# Patient Record
Sex: Female | Born: 1980 | Race: White | Hispanic: No | Marital: Married | Smoking: Former smoker
Health system: Southern US, Community
[De-identification: ages and names within clinical notes are randomized; demographics above are authoritative.]

## PROBLEM LIST (undated history)

## (undated) ENCOUNTER — Inpatient Hospital Stay (HOSPITAL_COMMUNITY): Payer: Self-pay

## (undated) DIAGNOSIS — J302 Other seasonal allergic rhinitis: Secondary | ICD-10-CM

## (undated) DIAGNOSIS — R000 Tachycardia, unspecified: Secondary | ICD-10-CM

## (undated) DIAGNOSIS — I498 Other specified cardiac arrhythmias: Secondary | ICD-10-CM

## (undated) DIAGNOSIS — I951 Orthostatic hypotension: Secondary | ICD-10-CM

## (undated) DIAGNOSIS — G90A Postural orthostatic tachycardia syndrome (POTS): Secondary | ICD-10-CM

## (undated) DIAGNOSIS — R12 Heartburn: Secondary | ICD-10-CM

## (undated) DIAGNOSIS — F419 Anxiety disorder, unspecified: Secondary | ICD-10-CM

## (undated) DIAGNOSIS — O26899 Other specified pregnancy related conditions, unspecified trimester: Secondary | ICD-10-CM

## (undated) HISTORY — DX: Other seasonal allergic rhinitis: J30.2

## (undated) HISTORY — DX: Tachycardia, unspecified: R00.0

## (undated) HISTORY — PX: OTHER SURGICAL HISTORY: SHX169

## (undated) HISTORY — PX: NO PAST SURGERIES: SHX2092

## (undated) HISTORY — PX: WISDOM TOOTH EXTRACTION: SHX21

---

## 2004-01-25 ENCOUNTER — Ambulatory Visit: Payer: Self-pay | Admitting: Family Medicine

## 2004-02-05 ENCOUNTER — Ambulatory Visit: Payer: Self-pay | Admitting: Family Medicine

## 2010-01-21 ENCOUNTER — Ambulatory Visit: Payer: Self-pay | Admitting: Diagnostic Radiology

## 2010-01-21 ENCOUNTER — Emergency Department (HOSPITAL_BASED_OUTPATIENT_CLINIC_OR_DEPARTMENT_OTHER): Admission: EM | Admit: 2010-01-21 | Discharge: 2010-01-21 | Payer: Self-pay | Admitting: Emergency Medicine

## 2011-01-07 LAB — OB RESULTS CONSOLE ABO/RH: RH Type: POSITIVE

## 2011-01-07 LAB — OB RESULTS CONSOLE RPR: RPR: NONREACTIVE

## 2011-01-29 ENCOUNTER — Inpatient Hospital Stay (HOSPITAL_COMMUNITY)
Admission: AD | Admit: 2011-01-29 | Discharge: 2011-01-29 | Disposition: A | Discharge status: Home / Self Care | Payer: Medicaid Other | Source: Ambulatory Visit | Attending: Obstetrics and Gynecology | Admitting: Obstetrics and Gynecology

## 2011-01-29 ENCOUNTER — Encounter (HOSPITAL_COMMUNITY): Payer: Self-pay | Admitting: *Deleted

## 2011-01-29 DIAGNOSIS — O219 Vomiting of pregnancy, unspecified: Secondary | ICD-10-CM | POA: Diagnosis present

## 2011-01-29 DIAGNOSIS — O210 Mild hyperemesis gravidarum: Secondary | ICD-10-CM | POA: Insufficient documentation

## 2011-01-29 LAB — URINALYSIS, ROUTINE W REFLEX MICROSCOPIC
Ketones, ur: >80 mg/dL — AB
Nitrite: NEGATIVE
Protein, ur: NEGATIVE mg/dL
Urobilinogen, UA: 1 mg/dL (ref 0.0–1.0)

## 2011-01-29 LAB — URINE MICROSCOPIC-ADD ON

## 2011-01-29 MED ORDER — PROMETHAZINE HCL 25 MG RE SUPP: 25.0000 mg | Freq: Four times a day (QID) | RECTAL | Status: AC | PRN

## 2011-01-29 MED ORDER — ONDANSETRON 8 MG/NS 50 ML IVPB
8.0000 mg | Freq: Once | INTRAVENOUS | Status: AC
Start: 2011-01-29 — End: 2011-01-29
  Administered 2011-01-29: 8 mg via INTRAVENOUS
  Filled 2011-01-29: qty 8

## 2011-01-29 MED ORDER — ONDANSETRON 4 MG PO TBDP: 4.0000 mg | ORAL_TABLET | Freq: Three times a day (TID) | ORAL | Status: DC | PRN

## 2011-01-29 MED ORDER — PROMETHAZINE HCL 25 MG/ML IJ SOLN
25.0000 mg | Freq: Once | INTRAMUSCULAR | Status: AC
  Administered 2011-01-29: 25 mg via INTRAVENOUS
  Filled 2011-01-29: qty 1

## 2011-01-29 MED ORDER — LACTATED RINGERS IV BOLUS (SEPSIS)
1000.0000 mL | Freq: Once | INTRAVENOUS | Status: AC
  Administered 2011-01-29: 1000 mL via INTRAVENOUS

## 2011-01-29 MED ORDER — DEXTROSE 5 % IN LACTATED RINGERS IV BOLUS
1000.0000 mL | Freq: Once | INTRAVENOUS | Status: AC
  Administered 2011-01-29: 1000 mL via INTRAVENOUS

## 2011-01-29 NOTE — Progress Notes (Signed)
Can't keep anything down, off and on. Had hyperemesis with first preg.   Symptoms are getting worse, zofran and phenergan not working.

## 2011-01-29 NOTE — ED Provider Notes (Signed)
History   Pt presents today c/o worsening N&V and dehydration. She states she has hyperemesis with her last pregnancy and she continues to have worsening sx with this preg. She denies vag dc, bleeding, fever, or any other sx at this time.  Chief Complaint  Patient presents with  . Morning Sickness   HPI  OB History    Grav Para Term Preterm Abortions TAB SAB Ect Mult Living   3 1 1  0 1 0 1 0 0 1      Past Medical History  Diagnosis Date  . Asthma     Past Surgical History  Procedure Date  . No past surgeries     History reviewed. No pertinent family history.  History  Substance Use Topics  . Smoking status: Never Smoker   . Smokeless tobacco: Not on file  . Alcohol Use: No    Allergies:  Allergies  Allergen Reactions  . Multivitamin Swelling    Prescriptions prior to admission  Medication Sig Dispense Refill  . albuterol (PROVENTIL HFA;VENTOLIN HFA) 108 (90 BASE) MCG/ACT inhaler Inhale 2 puffs into the lungs every 6 (six) hours as needed. Shortness of breathe       . budesonide (PULMICORT) 0.25 MG/2ML nebulizer solution Take 0.25 mg by nebulization daily. Shortness of breathe       . ondansetron (ZOFRAN-ODT) 4 MG disintegrating tablet Take 4 mg by mouth every 8 (eight) hours as needed. nausea       . promethazine (PHENERGAN) 25 MG tablet Take 25 mg by mouth every 6 (six) hours as needed. nausea         Review of Systems  Constitutional: Negative for fever.  Cardiovascular: Negative for chest pain.  Gastrointestinal: Positive for nausea and vomiting. Negative for abdominal pain, diarrhea and constipation.  Genitourinary: Negative for dysuria, urgency, frequency and hematuria.  Neurological: Negative for dizziness and headaches.  Psychiatric/Behavioral: Negative for depression and suicidal ideas.   Physical Exam   Blood pressure 117/87, pulse 91, temperature 98.5 F (36.9 C), resp. rate 20, height 5\' 6"  (1.676 m), weight 199 lb (90.266 kg).  Physical Exam    Nursing note and vitals reviewed. Constitutional: She is oriented to person, place, and time. She appears well-developed and well-nourished. No distress.  HENT:  Head: Normocephalic and atraumatic.  Eyes: EOM are normal. Pupils are equal, round, and reactive to light.  GI: Soft. She exhibits no distension. There is no tenderness. There is no rebound and no guarding.  Neurological: She is alert and oriented to person, place, and time.  Skin: Skin is warm and dry. She is not diaphoretic.  Psychiatric: She has a normal mood and affect. Her behavior is normal. Judgment and thought content normal.    MAU Course  Procedures  Results for orders placed during the hospital encounter of 01/29/11 (from the past 24 hour(s))  URINALYSIS, ROUTINE W REFLEX MICROSCOPIC     Status: Abnormal   Collection Time   01/29/11  7:20 PM      Component Value Range   Color, Urine YELLOW  YELLOW    Appearance CLEAR  CLEAR    Specific Gravity, Urine >1.030 (*) 1.005 - 1.030    pH 5.5  5.0 - 8.0    Glucose, UA NEGATIVE  NEGATIVE (mg/dL)   Hgb urine dipstick NEGATIVE  NEGATIVE    Bilirubin Urine SMALL (*) NEGATIVE    Ketones, ur >80 (*) NEGATIVE (mg/dL)   Protein, ur NEGATIVE  NEGATIVE (mg/dL)   Urobilinogen, UA 1.0  0.0 - 1.0 (mg/dL)   Nitrite NEGATIVE  NEGATIVE    Leukocytes, UA TRACE (*) NEGATIVE   URINE MICROSCOPIC-ADD ON     Status: Abnormal   Collection Time   01/29/11  7:20 PM      Component Value Range   Squamous Epithelial / LPF MANY (*) RARE    WBC, UA 3-6  <3 (WBC/hpf)   Bacteria, UA FEW (*) RARE    Urine-Other MUCOUS PRESENT        Assessment and Plan  Care of this pt was transferred to Georges Mouse, CNM at 8:08pm.  Clinton Gallant. Rice III, DrHSc, MPAS, PA-C  01/29/2011, 6:25 PM   Henrietta Hoover, PA 01/29/11 2009  Feeling better with IV hydration, Phenergan and Zofran  A/P: 30 y.o. G3P1011 at [redacted]w[redacted]d N/V in early pregnancy  Medication List  As of 01/30/2011  2:59 AM   START taking  these medications         promethazine 25 MG suppository   Commonly known as: PHENERGAN   Place 1 suppository (25 mg total) rectally every 6 (six) hours as needed for nausea. May also use suppositories vaginally         CONTINUE taking these medications         albuterol 108 (90 BASE) MCG/ACT inhaler   Commonly known as: PROVENTIL HFA;VENTOLIN HFA      budesonide 0.25 MG/2ML nebulizer solution   Commonly known as: PULMICORT      ondansetron 4 MG disintegrating tablet   Commonly known as: ZOFRAN-ODT   Take 1 tablet (4 mg total) by mouth every 8 (eight) hours as needed. nausea          Where to get your medications    These are the prescriptions that you need to pick up. We sent them to a specific pharmacy, so you will need to go there to get them.   [facility] - [place], [place] - [address]    [address] [place] [address]    Phone: [phone]        ondansetron 4 MG disintegrating tablet   promethazine 25 MG suppository            Follow up as scheduled

## 2011-01-30 ENCOUNTER — Encounter (HOSPITAL_COMMUNITY): Payer: Self-pay | Admitting: Advanced Practice Midwife

## 2011-02-04 ENCOUNTER — Encounter: Payer: Self-pay | Admitting: Internal Medicine

## 2011-02-05 ENCOUNTER — Institutional Professional Consult (permissible substitution): Payer: Self-pay | Admitting: Internal Medicine

## 2011-02-24 ENCOUNTER — Encounter (HOSPITAL_COMMUNITY): Payer: Self-pay | Admitting: Advanced Practice Midwife

## 2011-02-25 ENCOUNTER — Encounter: Payer: Self-pay | Admitting: Pulmonary Disease

## 2011-02-26 ENCOUNTER — Telehealth: Payer: Self-pay | Admitting: Internal Medicine

## 2011-02-26 ENCOUNTER — Encounter: Payer: Self-pay | Admitting: Internal Medicine

## 2011-02-26 ENCOUNTER — Ambulatory Visit (INDEPENDENT_AMBULATORY_CARE_PROVIDER_SITE_OTHER): Payer: Medicaid Other | Admitting: Internal Medicine

## 2011-02-26 DIAGNOSIS — J45909 Unspecified asthma, uncomplicated: Secondary | ICD-10-CM | POA: Insufficient documentation

## 2011-02-26 MED ORDER — BUDESONIDE-FORMOTEROL FUMARATE 160-4.5 MCG/ACT IN AERO: INHALATION_SPRAY | RESPIRATORY_TRACT | Status: DC

## 2011-02-26 NOTE — Assessment & Plan Note (Signed)
DDX of  difficult airways managment all start with A and  include Adherence, Ace Inhibitors, Acid Reflux, Active Sinus Disease, Alpha 1 Antitripsin deficiency, Anxiety masquerading as Airways dz,  ABPA,  allergy(esp in young), Aspiration (esp in elderly), Adverse effects of DPI,  Active smokers, plus two Bs  = Bronchiectasis and Beta blocker use..and one C= CHF  In this case Adherence is the biggest issue and starts with  inability to use HFA effectively and also  understand that SABA treats the symptoms but doesn't get to the underlying problem (inflammation).  I used  the analogy of putting steroid cream on a rash to help explain the meaning of topical therapy and the need to get the drug to the target tissue.  The proper method of use, as well as anticipated side effects, of this metered-dose inhaler are discussed and demonstrated to the patient. Improved to 75% so need to try combination therapy to see if symptoms can be controlled more effectively  Discussed in detail all the  indications, usual  risks and alternatives  relative to the benefits with patient who agrees to proceed with symbicort trial given risks of poorly controlled asthma during progressive IUP  See instructions for specific recommendations which were reviewed directly with the patient who was given a copy with highlighter outlining the key components.

## 2011-02-26 NOTE — Telephone Encounter (Signed)
ATC pt x1.  Was told that mail box is full and was unable to leave a message.

## 2011-02-26 NOTE — Patient Instructions (Signed)
symbicort 160 Take 2 puffs first thing in am and then another 2 puffs about 12 hours later.   Work on inhaler technique:  relax and gently blow all the way out then take a nice smooth deep breath back in, triggering the inhaler at same time you start breathing in.  Hold for up to 5 seconds if you can.  Rinse and gargle with water when done   If your mouth or throat starts to bother you,   I suggest you time the inhaler to your dental care and after using the inhaler(s) brush teeth and tongue with a baking soda containing toothpaste and when you rinse this out, gargle with it first to see if this helps your mouth and throat.      Please schedule a follow up office visit in 2 weeks, sooner if needed

## 2011-02-26 NOTE — Progress Notes (Signed)
  Subjective:    Patient ID: Jessica Hubbard, female    DOB: January 10, 1981, 30 y.o.   MRN: [id number]  HPI  60 yowf with " lifelong " allergies year round controlled with prn zyrtec and occ inhalers  increased  for 1st pregnancy around 2005 then back to baseline  and referred to pulmonary clinic 02/26/2011 by Dr Renaldo Fiddler for sob during her 2nd pregnancy.   02/26/2011 1st pulmonary eval @ [redacted] weeks gestation h/o intermittent asthma with less than 2 inhalers per year (increased during smoking which stopped 4 months before this pregancy) now  with increased need for saba x 2 months to point where uses up 5 x times daily so started on pulmicort x one month initially 2 bid and some better and didn't use quite so much ventolin but then gradually noted increased need for day and night ventolin and less consistent use of pulmicort which she isn't convinced is really working.  No purulent sputum or overt HB, some sinus congestion, no pain.  No unusual exposures/ triggers identified.      Review of Systems  Constitutional: Negative for fever, chills and unexpected weight change.  HENT: Positive for congestion and sneezing. Negative for ear pain, nosebleeds, sore throat, rhinorrhea, trouble swallowing, dental problem, voice change, postnasal drip and sinus pressure.   Eyes: Negative for visual disturbance.  Respiratory: Positive for shortness of breath. Negative for cough and choking.   Cardiovascular: Negative for chest pain and leg swelling.  Gastrointestinal: Negative for vomiting, abdominal pain and diarrhea.  Genitourinary: Negative for difficulty urinating.  Musculoskeletal: Negative for arthralgias.  Skin: Negative for rash.  Neurological: Negative for tremors, syncope and headaches.  Hematological: Does not bruise/bleed easily.       Objective:   Physical Exam  amb wf nad Wt 198 02/26/2011 HEENT: nl dentition, and orophanx. Nl external ear canals without cough reflex Moderate non-specific  turbinate edema with watery discharge   NECK :  without JVD/Nodes/TM/ nl carotid upstrokes bilaterally   LUNGS: no acc muscle use, clear to A and P bilaterally without cough on insp or exp maneuvers   CV:  RRR  no s3 or murmur or increase in P2, no edema   ABD:  soft and nontender with nl excursion in the supine position. No bruits or organomegaly, bowel sounds nl  MS:  warm without deformities, calf tenderness, cyanosis or clubbing  SKIN: warm and dry without lesions    NEURO:  alert, approp, no deficits        Assessment & Plan:

## 2011-02-27 NOTE — Telephone Encounter (Signed)
ATC NA and no option to leave a msg 

## 2011-02-28 NOTE — Telephone Encounter (Signed)
ATC x 1. Mailbox full.

## 2011-03-03 NOTE — Telephone Encounter (Signed)
ATC, mailbox still full, will sign encounter per protocol

## 2011-03-10 ENCOUNTER — Inpatient Hospital Stay (HOSPITAL_COMMUNITY): Admission: AD | Admit: 2011-03-10 | Payer: Self-pay | Source: Ambulatory Visit | Admitting: Obstetrics and Gynecology

## 2011-03-13 ENCOUNTER — Ambulatory Visit: Payer: Self-pay | Admitting: Internal Medicine

## 2011-05-23 ENCOUNTER — Encounter (HOSPITAL_COMMUNITY): Payer: Self-pay | Admitting: *Deleted

## 2011-05-23 ENCOUNTER — Inpatient Hospital Stay (HOSPITAL_COMMUNITY)
Admission: AD | Admit: 2011-05-23 | Discharge: 2011-05-23 | Disposition: A | Discharge status: Home / Self Care | Payer: Medicaid Other | Source: Ambulatory Visit | Attending: Obstetrics and Gynecology | Admitting: Obstetrics and Gynecology

## 2011-05-23 ENCOUNTER — Other Ambulatory Visit: Payer: Self-pay

## 2011-05-23 DIAGNOSIS — O368390 Maternal care for abnormalities of the fetal heart rate or rhythm, unspecified trimester, not applicable or unspecified: Secondary | ICD-10-CM

## 2011-05-23 DIAGNOSIS — O368990 Maternal care for other specified fetal problems, unspecified trimester, not applicable or unspecified: Secondary | ICD-10-CM

## 2011-05-23 DIAGNOSIS — I498 Other specified cardiac arrhythmias: Secondary | ICD-10-CM

## 2011-05-23 LAB — TSH: TSH: 1.814 u[IU]/mL (ref 0.350–4.500)

## 2011-05-23 LAB — COMPREHENSIVE METABOLIC PANEL
ALT: 9 U/L (ref 0–35)
Alkaline Phosphatase: 112 U/L (ref 39–117)
BUN: 4 mg/dL — ABNORMAL LOW (ref 6–23)
CO2: 22 mEq/L (ref 19–32)
Calcium: 9.6 mg/dL (ref 8.4–10.5)
GFR calc Af Amer: >90 mL/min (ref >90)
GFR calc non Af Amer: >90 mL/min (ref >90)
Glucose, Bld: 119 mg/dL — ABNORMAL HIGH (ref 70–99)
Sodium: 133 mEq/L — ABNORMAL LOW (ref 135–145)

## 2011-05-23 LAB — CBC
HCT: 34.3 % — ABNORMAL LOW (ref 36.0–46.0)
Hemoglobin: 11.3 g/dL — ABNORMAL LOW (ref 12.0–15.0)
MCH: 28.9 pg (ref 26.0–34.0)
MCV: 87.7 fL (ref 78.0–100.0)
RBC: 3.91 MIL/uL (ref 3.87–5.11)

## 2011-05-23 NOTE — MAU Note (Signed)
Pt states she had episode of rapid hr/palpitations-mild months ago, during this pregnancy. Today palpitations much worse. Has felt like she could pass out. Taken to room 2 for further eval.

## 2011-05-23 NOTE — MAU Note (Signed)
Cardiologist in with patient.

## 2011-05-23 NOTE — MAU Note (Signed)
Cardiology consult requested. D.Poe, CNM spoke with Morrison PA. No time frame given.

## 2011-05-23 NOTE — MAU Note (Signed)
Patient C/O rapid heart rate, SOB. States has been "hard to get through a grocery store for the past 2 months. The past 2 weeks has been much worse. Sometimes cannot walk across a room without SOB and takes longer to recover after sitting down. Was seen at office. Sent to MAU for further evaluation.

## 2011-05-23 NOTE — MAU Note (Signed)
Patient appears to become anxious when BP is in process of taking. Patient using breathing techniques during BP check and states she almost passed out in MD office when BP was being taken.

## 2011-05-23 NOTE — MAU Provider Note (Signed)
Chief Complaint:  Tachycardia   HPI  Jessica Hubbard is  31 y.o. G3P1011 at [redacted]w[redacted]d presents with 2 month history of feeling like her heart races when she stands or exerts herself. The onset is abrupt. It is relieved by sitting and lying down.For the past 2 weeks palpitations have worsened to the point that she gets fatigued and short of breath by walking across the room. She feels lightheaded and dizzy during the episodes, but has never fainted or lost consciousness. Denies chest pain or heart skipping beats. She denies having had similar episodes in her previous pregnancy for when not pregnant. Has used her inhaler 1-2 times in past 2 weeks and denies respiratory sx at present.  Good fetal activity. No leakage of fluid or vaginal bleeding. Pregnancy course otherwise `essentially uncomplicated.   Obstetrical/Gynecological History: NSVD x1 without complications.  Past Medical History: Past Medical History  Diagnosis Date  . Asthma   . Seasonal allergies     Past Surgical History: Past Surgical History  Procedure Date  . No past surgeries     Family History: Family History  Problem Relation Age of Onset  . Hypertension Father   . Hypertension Paternal Grandmother   . Cancer    . Anxiety disorder    . Depression      Social History: History  Substance Use Topics  . Smoking status: Former Smoker -- 0.5 packs/day for 2 years    Types: Cigarettes    Quit date: 06/09/2010  . Smokeless tobacco: Never Used  . Alcohol Use: No    Allergies:  Allergies  Allergen Reactions  . Multivitamin Swelling    Only through IV     Meds:  Prescriptions prior to admission  Medication Sig Dispense Refill  . budesonide-formoterol (SYMBICORT) 160-4.5 MCG/ACT inhaler Take 2 puffs first thing in am and then another 2 puffs about 12 hours later.         . calcium carbonate (TUMS - DOSED IN MG ELEMENTAL CALCIUM) 500 MG chewable tablet Chew 2 tablets by mouth once.        Review of  Systems Denies contractions, abdominal pain or vaginal bleeding. Reports good fetal activity.      Physical Exam  Blood pressure 119/74, pulse 101, temperature 98.4 F (36.9 C), temperature source Oral, resp. rate 18, height 5\' 6"  (1.676 m), weight 92.534 kg (204 lb), SpO2 100.00%.  Orthostatics done x2: Standing HR 150's and 130's with stable normal BP, pt felt weak and dizzy on standing  GENERAL: Well-developed, well-nourished female in no acute distress.  LUNGS: Clear to auscultation bilaterally.  HEART: Regular rate and rhythm. No murmur. ABDOMEN: Soft, nontender, gravid consistent with dates EXTREMITIES: Nontender, no edema, 2+ distal pulses. VE per RN: L/C/H FHT:  Baseline rate 135 bpm   Variability moderate  Accelerations present   Decelerations none Contractions: none, UI   Labs: Recent Results (from the past 24 hour(s))  CBC   Collection Time   05/23/11  1:15 PM      Component Value Range   WBC 15.1 (*) 4.0 - 10.5 (K/uL)   RBC 3.91  3.87 - 5.11 (MIL/uL)   Hemoglobin 11.3 (*) 12.0 - 15.0 (g/dL)   HCT 16.1 (*) 09.6 - 46.0 (%)   MCV 87.7  78.0 - 100.0 (fL)   MCH 28.9  26.0 - 34.0 (pg)   MCHC 32.9  30.0 - 36.0 (g/dL)   RDW 04.5  40.9 - 81.1 (%)   Platelets 311  150 - 400 (  K/uL)  COMPREHENSIVE METABOLIC PANEL   Collection Time   05/23/11  1:15 PM      Component Value Range   Sodium 133 (*) 135 - 145 (mEq/L)   Potassium 4.0  3.5 - 5.1 (mEq/L)   Chloride 100  96 - 112 (mEq/L)   CO2 22  19 - 32 (mEq/L)   Glucose, Bld 119 (*) 70 - 99 (mg/dL)   BUN 4 (*) 6 - 23 (mg/dL)   Creatinine, Ser 1.61 (*) 0.50 - 1.10 (mg/dL)   Calcium 9.6  8.4 - 09.6 (mg/dL)   Total Protein 6.6  6.0 - 8.3 (g/dL)   Albumin 2.7 (*) 3.5 - 5.2 (g/dL)   AST 14  0 - 37 (U/L)   ALT 9  0 - 35 (U/L)   Alkaline Phosphatase 112  39 - 117 (U/L)   Total Bilirubin 0.2 (*) 0.3 - 1.2 (mg/dL)   GFR calc non Af Amer >90  >90 (mL/min)   GFR calc Af Amer >90  >90 (mL/min)   Imaging Studies:  No results  found. ECG: NSR rate 98  Assessment/Plan: 31 yo G2P1001 at [redacted]w[redacted]d Cat 1 FHR Postural orthostatic tachycardia Leukocytosis  RN spoke with Dr. Renaldo Fiddler who requests Cards consult while pt here. [place] office called: I spoke with PA who will notify MD to come see pt. Cardiologist saw pt.      Tramain Gershman 3/15/20134:21 PM

## 2011-05-23 NOTE — Consult Note (Signed)
CARDIOLOGY CONSULT NOTE    Patient ID: Jessica Hubbard MRN: [id number] DOB/AGE: 1981-01-14 31 y.o.  Admit date: 05/23/2011 Referring Physician Zelphia Cairo MD Primary Physician N/A Primary Cardiologist new  Reason for Consultation palpitations  HPI: 38 yo WF seen at the request of Dr. Renaldo Fiddler for evaluation of palpitations. The patient is [redacted] weeks pregnant. Over the past 2 weeks she has experienced increasing palpitations. She describes this as a sensation of her heart racing. No irregularity. Worse with exertion or standing . Progressively worse this week. She presented to hospital for evaluation. Her pregnancy has been complicated by morning sickness so she supplements diet with Boost. No other complications. Has a 64 year old child at home and had no problems with that pregnancy. She does have a history of asthma but hasn't had to use her inhaler recently. No other recent medical illnesses. With her increased heart rate she does feel breathless. No syncope. Some lightheadedness with standing.  Review of systems complete and found to be negative unless listed above   Past Medical History  Diagnosis Date  . Asthma   . Seasonal allergies     Family History  Problem Relation Age of Onset  . Hypertension Father   . Hypertension Paternal Grandmother   . Cancer    . Anxiety disorder    . Depression      History   Social History  . Marital Status: Divorced    Spouse Name: N/A    Number of Children: 1  . Years of Education: N/A   Occupational History  . stay at home mom    Social History Main Topics  . Smoking status: Former Smoker -- 0.5 packs/day for 2 years    Types: Cigarettes    Quit date: 06/09/2010  . Smokeless tobacco: Never Used  . Alcohol Use: No  . Drug Use: No  . Sexually Active: Yes   Other Topics Concern  . Not on file   Social History Narrative   ** Merged History Encounter **     Past Surgical History  Procedure Date  . No past surgeries       Prescriptions prior to admission  Medication Sig Dispense Refill  . budesonide-formoterol (SYMBICORT) 160-4.5 MCG/ACT inhaler Take 2 puffs first thing in am and then another 2 puffs about 12 hours later.         . calcium carbonate (TUMS - DOSED IN MG ELEMENTAL CALCIUM) 500 MG chewable tablet Chew 2 tablets by mouth once.        Physical Exam: Blood pressure 113/73, pulse 91, temperature 98.4 F (36.9 C), temperature source Oral, resp. rate 18, height 5\' 6"  (1.676 m), weight 92.534 kg (204 lb), SpO2 100.00%. No orthostatic BP change. Heart rate increases to 150 bpm with standing and reproduces her symptoms. Pleasant gravid white female in NAD. Appears anxious. The patient is alert and oriented x 3.  The mood and affect are normal.  The skin is warm and dry.  Color is normal.  The HEENT exam reveals that the sclera are nonicteric.  The mucous membranes are moist.  The carotids are 2+ without bruits.  There is no thyromegaly.  There is no JVD.  The lungs are clear.  The chest wall is non tender.  The heart exam reveals a regular rate with a normal S1 and S2.  There are no murmurs, gallops, or rubs.  The PMI is not displaced.  She is gravid.  Abdominal exam reveals good bowel sounds.  There  is no guarding or rebound.  There is no hepatosplenomegaly or tenderness.  There are no masses.  Exam of the legs reveal no clubbing, cyanosis, or edema.  The legs are without rashes.  The distal pulses are intact.  Cranial nerves II - XII are intact.  Motor and sensory functions are intact.  The gait is normal.   Labs:   Lab Results  Component Value Date   WBC 15.1* 05/23/2011   HGB 11.3* 05/23/2011   HCT 34.3* 05/23/2011   MCV 87.7 05/23/2011   PLT 311 05/23/2011    Lab 05/23/11 1315  NA 133*  K 4.0  CL 100  CO2 22  BUN 4*  CREATININE 0.48*  CALCIUM 9.6  PROT 6.6  BILITOT 0.2*  ALKPHOS 112  ALT 9  AST 14  GLUCOSE 119*   No results found for this basename: CKTOTAL, CKMB, CKMBINDEX, TROPONINI     No results found for this basename: CHOL   No results found for this basename: HDL   No results found for this basename: LDLCALC   No results found for this basename: TRIG   No results found for this basename: CHOLHDL   No results found for this basename: LDLDIRECT      Radiology: N/A EKG: NSR with rate 98 bpm. Normal Ecg. Repeat Ecg with standing shows sinus tachycardia with rate of 140 bpm. Nonspecific T wave abnormality in the inferolateral leads.   ASSESSMENT AND PLAN:  1. Sinus tachycardia associated with upright position. No cyanosis. No clear etiology. 2. [redacted] week gestation.  Plan: will check thyroid status. Plan Echocardiogram as outpatient. Avoid caffeine or stimulants. We will follow up in the office after Echo.   Signed: Odelle Kosier [place] 05/23/2011, 5:25 PM

## 2011-05-29 ENCOUNTER — Telehealth: Payer: Self-pay

## 2011-05-29 NOTE — Telephone Encounter (Signed)
Patient called,need to schedule appointment with Dr.Jordan.Patient stated she was going to transfer care to a cardiologist in [place].Stated she lived in [place] and would just be closer.

## 2011-06-03 ENCOUNTER — Other Ambulatory Visit: Payer: Self-pay

## 2011-06-03 ENCOUNTER — Other Ambulatory Visit (HOSPITAL_COMMUNITY): Payer: Self-pay

## 2011-06-03 ENCOUNTER — Telehealth: Payer: Self-pay | Admitting: Cardiology

## 2011-06-03 ENCOUNTER — Ambulatory Visit (HOSPITAL_COMMUNITY): Payer: Medicaid Other | Attending: Cardiology

## 2011-06-03 DIAGNOSIS — R002 Palpitations: Secondary | ICD-10-CM

## 2011-06-03 DIAGNOSIS — R0602 Shortness of breath: Secondary | ICD-10-CM | POA: Insufficient documentation

## 2011-06-03 DIAGNOSIS — R000 Tachycardia, unspecified: Secondary | ICD-10-CM

## 2011-06-03 DIAGNOSIS — I2510 Atherosclerotic heart disease of native coronary artery without angina pectoris: Secondary | ICD-10-CM | POA: Insufficient documentation

## 2011-06-03 NOTE — Telephone Encounter (Signed)
New Msg: Pt calling wanting to speak with nurse. Please return pt call to discuss further.  

## 2011-06-03 NOTE — Telephone Encounter (Signed)
Patient called, stating she is having a new problem.States her heart is beating fast even while sitting,was beating fast when she stood up.States this is been going on for 2 days, heart rate 135 beats/min.The new office where she has transferred to has not returned her phone call.States the soonest appointment is 3 weeks away.States wants to see Dr.Jordan now.Dr.Jordan out of office today,spoke to Norma Fredrickson NP advised to hydrate well and schedule echo as ordered,will speak to Dr.Jordan tomorrow 06/04/11.

## 2011-06-04 ENCOUNTER — Telehealth: Payer: Self-pay | Admitting: Cardiology

## 2011-06-04 NOTE — Telephone Encounter (Signed)
Spoke to patient was told appointment scheduled with Norma Fredrickson NP 06/11/11.

## 2011-06-04 NOTE — Telephone Encounter (Signed)
Patient called was told spoke to Dr.Jordan.Advised to schedule echo and see Norma Fredrickson NP.Echo was done 06/03/11.Appointment scheduled with Lawson Fiscal 06/11/11.

## 2011-06-04 NOTE — Telephone Encounter (Signed)
Follow- up: ° ° °Patient returned your phone call. Please call back. °

## 2011-06-05 ENCOUNTER — Telehealth: Payer: Self-pay | Admitting: Cardiology

## 2011-06-05 NOTE — Telephone Encounter (Signed)
Pt has more questions, pls call

## 2011-06-05 NOTE — Telephone Encounter (Signed)
Patient called no answer.LMTC. 

## 2011-06-05 NOTE — Telephone Encounter (Signed)
Patient called requesting a handicap sticker.Temporary handicap form signed by Dr.Jordan.Patient will pick up next week at her appointment.

## 2011-06-11 ENCOUNTER — Encounter: Payer: Self-pay | Admitting: Nurse Practitioner

## 2011-06-11 ENCOUNTER — Ambulatory Visit (INDEPENDENT_AMBULATORY_CARE_PROVIDER_SITE_OTHER): Payer: Medicaid Other | Admitting: Nurse Practitioner

## 2011-06-11 VITALS — BP 108/74 | HR 129 | Ht 66.0 in | Wt 202.0 lb

## 2011-06-11 DIAGNOSIS — R000 Tachycardia, unspecified: Secondary | ICD-10-CM

## 2011-06-11 MED ORDER — SODIUM CHLORIDE 0.9 % IV BOLUS (SEPSIS): INTRAVENOUS | Status: DC

## 2011-06-11 NOTE — Assessment & Plan Note (Signed)
Patient presents with persistent tachycardia and is felt to have POTS. This is felt to be volume related that is exacerbated by her pregnancy. We have discussed the possibility of giving her IV fluids. We are going to try giving her a liter and a half of normal saline tomorrow after her OB appointment. If this results in an improvement in her symptoms, then may need to consider this probably weekly until she delivers. Her volume status will need to be monitored closely at the time of delivery as well. Dr. [place] is going to speak with her OB, Dr. Renaldo Fiddler. Leslieann is to continue with hydration and liberalizing her salt. Patient is agreeable to this plan and will call if any problems develop in the interim.

## 2011-06-11 NOTE — Patient Instructions (Signed)
We are going to give you some IV fluids tomorrow to see if this helps  Continue to drink lots of water and liberalize your salt intake.  Call the [facility] office at [phone] if you have any questions, problems or concerns.

## 2011-06-11 NOTE — Progress Notes (Signed)
   Jessica Hubbard Date of Birth: 1980/04/19 Medical Record #[id number]  History of Present Illness: Jessica Hubbard is seen today for a follow up visit. She is subsequently seen with Dr. Swaziland. She is a 31 year old female who is currently [redacted] weeks pregnant. She has increasing palpitations with documented tachycardia. Her tachycardia is worse with even minimal exertion, stress or even trying to concentrate. It is felt that she has POTS (postural orthostatic tachycardia syndome). It is felt to be related to volume. Her pregnancy is felt to be exacerbating this problem (with baby pressing on the vena cava and having decresed venous return) and will probably not resolve until she delivers. She has basically become home bound. She has limited her activities greatly. She feels quite frustrated. Unfortunately, her boyfriend works out of town and she has a 33 year old at home. She really has no other support. She is trying to drink over 6 bottles of water a day. She is liberalizing her salt. She does note that when her heart rate goes up, she will get some aching in her upper arms and shoulders.  Current Outpatient Prescriptions on File Prior to Visit  Medication Sig Dispense Refill  . budesonide-formoterol (SYMBICORT) 160-4.5 MCG/ACT inhaler Take 2 puffs first thing in am and then another 2 puffs about 12 hours later.         . calcium carbonate (TUMS - DOSED IN MG ELEMENTAL CALCIUM) 500 MG chewable tablet Chew 2 tablets by mouth once.      Marland Kitchen DISCONTD: albuterol (PROVENTIL HFA;VENTOLIN HFA) 108 (90 BASE) MCG/ACT inhaler Inhale 2 puffs into the lungs every 6 (six) hours as needed. Shortness of breathe         Allergies  Allergen Reactions  . Hexavitamins Swelling    Only through IV     Past Medical History  Diagnosis Date  . Asthma   . Seasonal allergies   . Tachycardia     Past Surgical History  Procedure Date  . No past surgeries     History  Smoking status  . Former Smoker -- 0.5 packs/day for  2 years  . Types: Cigarettes  . Quit date: 06/09/2010  Smokeless tobacco  . Never Used    History  Alcohol Use No    Family History  Problem Relation Age of Onset  . Hypertension Father   . Hypertension Paternal Grandmother   . Cancer    . Anxiety disorder    . Depression    . Heart attack Mother     Review of Systems: The review of systems is positive for tachycardia.  All other systems were reviewed and are negative.  Physical Exam: BP 108/74  Pulse 129  Ht 5\' 6"  (1.676 m)  Wt 202 lb (91.627 kg)  BMI 32.60 kg/m2 Patient is very pleasant and in no acute distress. She is pregnant. Skin is warm and dry. Color is normal.  HEENT is unremarkable. Normocephalic/atraumatic. PERRL. Sclera are nonicteric. Neck is supple. No masses. No JVD. Lungs are clear. Cardiac exam shows her to be tachycardic. No murmur. Abdomen is gravida and soft. Extremities are without edema. Gait and ROM are intact. No gross neurologic deficits noted.  LABORATORY DATA: EKG continues to show sinus tachycardia.    Assessment / Plan:

## 2011-06-12 ENCOUNTER — Ambulatory Visit (INDEPENDENT_AMBULATORY_CARE_PROVIDER_SITE_OTHER): Payer: Medicaid Other | Admitting: Cardiology

## 2011-06-12 DIAGNOSIS — R000 Tachycardia, unspecified: Secondary | ICD-10-CM

## 2011-06-12 MED ORDER — SODIUM CHLORIDE 0.9 % IV SOLN
Freq: Once | INTRAVENOUS | Status: AC
  Administered 2011-06-12: 1500 mL via INTRAVENOUS

## 2011-06-12 NOTE — Progress Notes (Signed)
Pt here per lori gerhardt np for fluids. 20 ga in right wrist. Ns saline infusion completed.

## 2011-06-13 ENCOUNTER — Telehealth: Payer: Self-pay | Admitting: Cardiology

## 2011-06-13 NOTE — Telephone Encounter (Signed)
Patient called was told spoke to Dr.Jordan he advised she can go to [facility] and receive IV Fluids 1500 cc NS over 3 to 4 hrs.as needed.Order faxed to [phone].Patient was given # [phone] to call to schedule appointment.Also advised to increase salt in diet.Advised to call back if needed.

## 2011-06-13 NOTE — Telephone Encounter (Signed)
FU Call: Pt returning call to nurse. Please return pt call to discuss further.  

## 2011-06-13 NOTE — Telephone Encounter (Signed)
Patient called back stated IV fluids did help yesterday.Stated this morning she has noticed fast heart beat again but does seem a little better.Advised will let Dr.Jordan know and get his advise on receiving more IV fluids.

## 2011-06-16 ENCOUNTER — Other Ambulatory Visit (HOSPITAL_COMMUNITY): Payer: Self-pay | Admitting: *Deleted

## 2011-06-18 ENCOUNTER — Ambulatory Visit: Payer: Self-pay | Admitting: Physician Assistant

## 2011-06-18 ENCOUNTER — Encounter (HOSPITAL_COMMUNITY)
Admission: RE | Admit: 2011-06-18 | Discharge: 2011-06-18 | Disposition: A | Discharge status: Home / Self Care | Payer: Medicaid Other | Source: Ambulatory Visit | Attending: Cardiology | Admitting: Cardiology

## 2011-06-18 DIAGNOSIS — R000 Tachycardia, unspecified: Secondary | ICD-10-CM | POA: Insufficient documentation

## 2011-06-18 MED ORDER — SODIUM CHLORIDE 0.9 % IV SOLN
Freq: Once | INTRAVENOUS | Status: AC
  Administered 2011-06-18: 10:00:00 via INTRAVENOUS

## 2011-06-19 NOTE — Telephone Encounter (Signed)
Patient request return call from Nurse CP, she can be reached at [phone]

## 2011-06-19 NOTE — Telephone Encounter (Signed)
Patient called stated received IV fluids yesterday 06/18/11.Stated IV fluids only help for that day.Advised she can receive IV fluids weekly or as needed.States dont know whether it is worth it since she has to drive from [place].

## 2011-06-25 ENCOUNTER — Encounter (HOSPITAL_COMMUNITY): Payer: Self-pay | Admitting: Pharmacist

## 2011-07-10 ENCOUNTER — Encounter (HOSPITAL_COMMUNITY): Payer: Self-pay

## 2011-07-10 ENCOUNTER — Encounter (HOSPITAL_COMMUNITY)
Admission: RE | Admit: 2011-07-10 | Discharge: 2011-07-10 | Disposition: A | Discharge status: Home / Self Care | Payer: Medicaid Other | Source: Ambulatory Visit | Attending: Obstetrics and Gynecology | Admitting: Obstetrics and Gynecology

## 2011-07-10 HISTORY — DX: Other specified pregnancy related conditions, unspecified trimester: O26.899

## 2011-07-10 HISTORY — DX: Other specified cardiac arrhythmias: I49.8

## 2011-07-10 HISTORY — DX: Tachycardia, unspecified: R00.0

## 2011-07-10 HISTORY — DX: Postural orthostatic tachycardia syndrome (POTS): G90.A

## 2011-07-10 HISTORY — DX: Orthostatic hypotension: I95.1

## 2011-07-10 HISTORY — DX: Heartburn: R12

## 2011-07-10 LAB — BASIC METABOLIC PANEL
GFR calc Af Amer: >90 mL/min (ref >90)
GFR calc non Af Amer: >90 mL/min (ref >90)
Potassium: 3.8 mEq/L (ref 3.5–5.1)
Sodium: 134 mEq/L — ABNORMAL LOW (ref 135–145)

## 2011-07-10 LAB — SURGICAL PCR SCREEN
MRSA, PCR: POSITIVE — AB
Staphylococcus aureus: POSITIVE — AB

## 2011-07-10 LAB — CBC
MCHC: 32.4 g/dL (ref 30.0–36.0)
Platelets: 260 10*3/uL (ref 150–400)
RDW: 14 % (ref 11.5–15.5)

## 2011-07-10 NOTE — Patient Instructions (Signed)
YOUR PROCEDURE IS SCHEDULED ON:07/17/11  ENTER THROUGH THE MAIN ENTRANCE OF [facility] AT:1130am  USE DESK PHONE AND DIAL [id number] TO INFORM us OF YOUR ARRIVAL  CALL [phone] IF YOU HAVE ANY QUESTIONS OR PROBLEMS PRIOR TO YOUR ARRIVAL.  REMEMBER: DO NOT EAT AFTER MIDNIGHT : Wed  SPECIAL INSTRUCTIONS:clear liquids ok until 9am on Thursday   YOU MAY BRUSH YOUR TEETH THE MORNING OF SURGERY   TAKE THESE MEDICINES THE DAY OF SURGERY WITH SIP OF WATER: bring inhaler   DO NOT WEAR JEWELRY, EYE MAKEUP, LIPSTICK OR DARK FINGERNAIL POLISH DO NOT WEAR LOTIONS  DO NOT SHAVE FOR 48 HOURS PRIOR TO SURGERY  YOU WILL NOT BE ALLOWED TO DRIVE YOURSELF HOME.  NAME OF DRIVER: Jessica Hubbard

## 2011-07-10 NOTE — Telephone Encounter (Signed)
New problem:  Per Marylu Lund from [facility] pre-op. Due date 5/21.  C-section on 5/9. Wanted to let Dr. Swaziland know.

## 2011-07-10 NOTE — Anesthesia Preprocedure Evaluation (Signed)
Anesthesia Evaluation  Patient identified by MRN, date of birth, ID band Patient awake    Reviewed: Allergy & Precautions, H&P , Patient's Chart, lab work & pertinent test results  Airway Mallampati: III TM Distance: >3 FB Neck ROM: Full    Dental No notable dental hx. (+) Teeth Intact   Pulmonary asthma ,  breath sounds clear to auscultation  Pulmonary exam normal       Cardiovascular Rhythm:Regular Rate:Tachycardia  Severe POTS    Neuro/Psych negative neurological ROS  negative psych ROS   GI/Hepatic Neg liver ROS, GERD-  Controlled,  Endo/Other  negative endocrine ROS  Renal/GU negative Renal ROS  negative genitourinary   Musculoskeletal negative musculoskeletal ROS (+)   Abdominal   Peds  Hematology negative hematology ROS (+)   Anesthesia Other Findings   Reproductive/Obstetrics (+) Pregnancy                           Anesthesia Physical Anesthesia Plan  ASA: II  Anesthesia Plan: Spinal   Post-op Pain Management:    Induction:   Airway Management Planned: Natural Airway  Additional Equipment:   Intra-op Plan:   Post-operative Plan:   Informed Consent: I have reviewed the patients History and Physical, chart, labs and discussed the procedure including the risks, benefits and alternatives for the proposed anesthesia with the patient or authorized representative who has indicated his/her understanding and acceptance.     Plan Discussed with: Anesthesiologist  Anesthesia Plan Comments: (Consider Phenylephrine infusion for procedure.)        Anesthesia Quick Evaluation

## 2011-07-11 NOTE — Telephone Encounter (Signed)
Patient called, no answer.Left message on personal voice mail Dr.Jordan was told of C-Section 07/17/11,he says good luck,hope everything goes well.

## 2011-07-15 NOTE — H&P (Addendum)
31 yo G3P1 @ 38+ weeks presents for primary c-section because of severe POTS syndrome.  Pt declines TOL because symptoms are debilitating and she doesn't want to labor and push.  She has been evaluated by cardio and followed by them throughout her pregnancy.  She has been unable to work and is having difficulty caring for herself and toddler because of severe SOB and dizziness.  ECHO and EKG wnl.  PMHx: Asthma, POTS PSHx: none OBHx:  SVD, missed ab All:  hexavits Meds:  PNV FHx:  Melanoma SHx:  H/o tobacco, quit w/ pregnancy  AF, VSS  Sinus tachy, + FHT Gen - NAD Abd - gravid, NT CV - sinus tachy, Lungs- CTAB Abd - gravid, NT Cvx - deferred  A/P:  Pregnancy complicated by severe POTS.  Discussed risks of delivery and c-section prior to 39 weeks.  Pt understands and informed consent obtaine.d  Discussed plan of care again with pt and TOL vs primary c-section.  Pt declines TOL.

## 2011-07-16 MED ORDER — DEXTROSE 5 % IV SOLN
2.0000 g | INTRAVENOUS | Status: AC
  Administered 2011-07-17: 2 g via INTRAVENOUS
  Filled 2011-07-16: qty 2

## 2011-07-17 ENCOUNTER — Encounter (HOSPITAL_COMMUNITY): Payer: Self-pay | Admitting: *Deleted

## 2011-07-17 ENCOUNTER — Inpatient Hospital Stay (HOSPITAL_COMMUNITY): Payer: Medicaid Other

## 2011-07-17 ENCOUNTER — Encounter (HOSPITAL_COMMUNITY)
Admission: RE | Disposition: A | Discharge status: Home / Self Care | Payer: Self-pay | Source: Ambulatory Visit | Attending: Obstetrics and Gynecology

## 2011-07-17 ENCOUNTER — Encounter (HOSPITAL_COMMUNITY): Payer: Self-pay | Admitting: Anesthesiology

## 2011-07-17 ENCOUNTER — Inpatient Hospital Stay (HOSPITAL_COMMUNITY)
Admission: RE | Admit: 2011-07-17 | Discharge: 2011-07-19 | DRG: 766 | Disposition: A | Discharge status: Home / Self Care | Payer: Medicaid Other | Source: Ambulatory Visit | Attending: Obstetrics and Gynecology | Admitting: Obstetrics and Gynecology

## 2011-07-17 DIAGNOSIS — Z98891 History of uterine scar from previous surgery: Secondary | ICD-10-CM

## 2011-07-17 DIAGNOSIS — I498 Other specified cardiac arrhythmias: Secondary | ICD-10-CM | POA: Diagnosis present

## 2011-07-17 DIAGNOSIS — I2510 Atherosclerotic heart disease of native coronary artery without angina pectoris: Principal | ICD-10-CM | POA: Diagnosis present

## 2011-07-17 DIAGNOSIS — O219 Vomiting of pregnancy, unspecified: Secondary | ICD-10-CM

## 2011-07-17 DIAGNOSIS — R000 Tachycardia, unspecified: Secondary | ICD-10-CM

## 2011-07-17 HISTORY — DX: Anxiety disorder, unspecified: F41.9

## 2011-07-17 SURGERY — Surgical Case
Anesthesia: Spinal | Wound class: Clean Contaminated

## 2011-07-17 MED ORDER — SENNOSIDES-DOCUSATE SODIUM 8.6-50 MG PO TABS
2.0000 | ORAL_TABLET | Freq: Every day | ORAL | Status: DC
  Administered 2011-07-17 – 2011-07-18 (×2): 2 via ORAL

## 2011-07-17 MED ORDER — DIPHENHYDRAMINE HCL 50 MG/ML IJ SOLN
25.0000 mg | INTRAMUSCULAR | Status: DC | PRN
  Filled 2011-07-17: qty 1

## 2011-07-17 MED ORDER — DEXTROSE IN LACTATED RINGERS 5 % IV SOLN
INTRAVENOUS | Status: DC
  Administered 2011-07-17: 22:00:00 via INTRAVENOUS

## 2011-07-17 MED ORDER — IBUPROFEN 600 MG PO TABS
600.0000 mg | ORAL_TABLET | Freq: Four times a day (QID) | ORAL | Status: DC | PRN
  Filled 2011-07-17 (×4): qty 1

## 2011-07-17 MED ORDER — KETOROLAC TROMETHAMINE 60 MG/2ML IM SOLN
60.0000 mg | Freq: Once | INTRAMUSCULAR | Status: AC | PRN
  Administered 2011-07-17: 60 mg via INTRAMUSCULAR

## 2011-07-17 MED ORDER — KETOROLAC TROMETHAMINE 60 MG/2ML IM SOLN
INTRAMUSCULAR | Status: AC
  Filled 2011-07-17: qty 2

## 2011-07-17 MED ORDER — FENTANYL CITRATE 0.05 MG/ML IJ SOLN
INTRAMUSCULAR | Status: AC
  Filled 2011-07-17: qty 2

## 2011-07-17 MED ORDER — SIMETHICONE 80 MG PO CHEW: 80.0000 mg | CHEWABLE_TABLET | ORAL | Status: DC | PRN

## 2011-07-17 MED ORDER — PANTOPRAZOLE SODIUM 40 MG PO TBEC
40.0000 mg | DELAYED_RELEASE_TABLET | Freq: Once | ORAL | Status: AC
  Administered 2011-07-17: 40 mg via ORAL

## 2011-07-17 MED ORDER — MENTHOL 3 MG MT LOZG: 1.0000 | LOZENGE | OROMUCOSAL | Status: DC | PRN

## 2011-07-17 MED ORDER — DIPHENHYDRAMINE HCL 50 MG/ML IJ SOLN: 12.5000 mg | INTRAMUSCULAR | Status: DC | PRN

## 2011-07-17 MED ORDER — SCOPOLAMINE 1 MG/3DAYS TD PT72
MEDICATED_PATCH | TRANSDERMAL | Status: AC
  Administered 2011-07-17: 1.5 mg via TRANSDERMAL
  Filled 2011-07-17: qty 1

## 2011-07-17 MED ORDER — FENTANYL CITRATE 0.05 MG/ML IJ SOLN: 25.0000 ug | INTRAMUSCULAR | Status: DC | PRN

## 2011-07-17 MED ORDER — SCOPOLAMINE 1 MG/3DAYS TD PT72
1.0000 | MEDICATED_PATCH | Freq: Once | TRANSDERMAL | Status: DC
  Administered 2011-07-17: 1.5 mg via TRANSDERMAL

## 2011-07-17 MED ORDER — PHENYLEPHRINE 40 MCG/ML (10ML) SYRINGE FOR IV PUSH (FOR BLOOD PRESSURE SUPPORT)
PREFILLED_SYRINGE | INTRAVENOUS | Status: AC
  Filled 2011-07-17: qty 5

## 2011-07-17 MED ORDER — OXYTOCIN 20 UNITS IN LACTATED RINGERS INFUSION - SIMPLE
125.0000 mL/h | INTRAVENOUS | Status: AC
  Administered 2011-07-17: 125 mL/h via INTRAVENOUS

## 2011-07-17 MED ORDER — MEASLES, MUMPS & RUBELLA VAC ~~LOC~~ INJ: 0.5000 mL | INJECTION | Freq: Once | SUBCUTANEOUS | Status: DC

## 2011-07-17 MED ORDER — OXYTOCIN 10 UNIT/ML IJ SOLN
INTRAMUSCULAR | Status: AC
  Filled 2011-07-17: qty 1

## 2011-07-17 MED ORDER — SODIUM CHLORIDE 0.9 % IV SOLN
1.0000 ug/kg/h | INTRAVENOUS | Status: DC | PRN
  Filled 2011-07-17: qty 2.5

## 2011-07-17 MED ORDER — IBUPROFEN 600 MG PO TABS
600.0000 mg | ORAL_TABLET | Freq: Four times a day (QID) | ORAL | Status: DC
  Administered 2011-07-18 – 2011-07-19 (×6): 600 mg via ORAL
  Filled 2011-07-17 (×2): qty 1

## 2011-07-17 MED ORDER — BUDESONIDE-FORMOTEROL FUMARATE 160-4.5 MCG/ACT IN AERO: 2.0000 | INHALATION_SPRAY | Freq: Two times a day (BID) | RESPIRATORY_TRACT | Status: DC | PRN

## 2011-07-17 MED ORDER — PANTOPRAZOLE SODIUM 40 MG PO TBEC
DELAYED_RELEASE_TABLET | ORAL | Status: AC
  Filled 2011-07-17: qty 1

## 2011-07-17 MED ORDER — OXYTOCIN 20 UNITS IN LACTATED RINGERS INFUSION - SIMPLE
INTRAVENOUS | Status: AC
  Filled 2011-07-17: qty 1000

## 2011-07-17 MED ORDER — SODIUM CHLORIDE 0.9 % IJ SOLN: 3.0000 mL | INTRAMUSCULAR | Status: DC | PRN

## 2011-07-17 MED ORDER — NALBUPHINE HCL 10 MG/ML IJ SOLN
5.0000 mg | INTRAMUSCULAR | Status: DC | PRN
  Filled 2011-07-17: qty 1

## 2011-07-17 MED ORDER — ONDANSETRON HCL 4 MG/2ML IJ SOLN
INTRAMUSCULAR | Status: AC
  Filled 2011-07-17: qty 2

## 2011-07-17 MED ORDER — 0.9 % SODIUM CHLORIDE (POUR BTL) OPTIME
TOPICAL | Status: DC | PRN
  Administered 2011-07-17: 1000 mL

## 2011-07-17 MED ORDER — LANOLIN HYDROUS EX OINT: 1.0000 "application " | TOPICAL_OINTMENT | CUTANEOUS | Status: DC | PRN

## 2011-07-17 MED ORDER — DIPHENHYDRAMINE HCL 25 MG PO CAPS: 25.0000 mg | ORAL_CAPSULE | Freq: Four times a day (QID) | ORAL | Status: DC | PRN

## 2011-07-17 MED ORDER — KETOROLAC TROMETHAMINE 30 MG/ML IJ SOLN: 30.0000 mg | Freq: Four times a day (QID) | INTRAMUSCULAR | Status: AC | PRN

## 2011-07-17 MED ORDER — ONDANSETRON HCL 4 MG/2ML IJ SOLN: 4.0000 mg | Freq: Three times a day (TID) | INTRAMUSCULAR | Status: DC | PRN

## 2011-07-17 MED ORDER — OXYCODONE-ACETAMINOPHEN 5-325 MG PO TABS
1.0000 | ORAL_TABLET | ORAL | Status: DC | PRN
Start: 2011-07-17 — End: 2011-07-19
  Administered 2011-07-18 (×3): 2 via ORAL
  Administered 2011-07-18: 1 via ORAL
  Administered 2011-07-19: 2 via ORAL
  Filled 2011-07-17: qty 1
  Filled 2011-07-17 (×4): qty 2
  Filled 2011-07-17: qty 1

## 2011-07-17 MED ORDER — OXYTOCIN 10 UNIT/ML IJ SOLN
INTRAMUSCULAR | Status: DC | PRN
  Administered 2011-07-17: 20 [IU]
  Administered 2011-07-17: 5 [IU]

## 2011-07-17 MED ORDER — KETOROLAC TROMETHAMINE 30 MG/ML IJ SOLN
30.0000 mg | Freq: Four times a day (QID) | INTRAMUSCULAR | Status: AC | PRN
  Administered 2011-07-17: 30 mg via INTRAVENOUS
  Filled 2011-07-17: qty 1

## 2011-07-17 MED ORDER — PHENYLEPHRINE HCL 10 MG/ML IJ SOLN
INTRAMUSCULAR | Status: DC | PRN
  Administered 2011-07-17 (×6): 80 ug via INTRAVENOUS

## 2011-07-17 MED ORDER — MEPERIDINE HCL 25 MG/ML IJ SOLN
6.2500 mg | INTRAMUSCULAR | Status: DC | PRN
  Administered 2011-07-17: 6.25 mg via INTRAVENOUS

## 2011-07-17 MED ORDER — ONDANSETRON HCL 4 MG/2ML IJ SOLN: 4.0000 mg | INTRAMUSCULAR | Status: DC | PRN

## 2011-07-17 MED ORDER — MORPHINE SULFATE 0.5 MG/ML IJ SOLN
INTRAMUSCULAR | Status: AC
  Filled 2011-07-17: qty 10

## 2011-07-17 MED ORDER — DIBUCAINE 1 % RE OINT: 1.0000 "application " | TOPICAL_OINTMENT | RECTAL | Status: DC | PRN

## 2011-07-17 MED ORDER — TETANUS-DIPHTH-ACELL PERTUSSIS 5-2.5-18.5 LF-MCG/0.5 IM SUSP: 0.5000 mL | Freq: Once | INTRAMUSCULAR | Status: DC

## 2011-07-17 MED ORDER — METOCLOPRAMIDE HCL 5 MG/ML IJ SOLN: 10.0000 mg | Freq: Three times a day (TID) | INTRAMUSCULAR | Status: DC | PRN

## 2011-07-17 MED ORDER — SIMETHICONE 80 MG PO CHEW
80.0000 mg | CHEWABLE_TABLET | Freq: Three times a day (TID) | ORAL | Status: DC
  Administered 2011-07-17 – 2011-07-19 (×7): 80 mg via ORAL

## 2011-07-17 MED ORDER — ONDANSETRON HCL 4 MG/2ML IJ SOLN
INTRAMUSCULAR | Status: DC | PRN
  Administered 2011-07-17: 4 mg via INTRAVENOUS

## 2011-07-17 MED ORDER — OXYTOCIN 10 UNIT/ML IJ SOLN
INTRAMUSCULAR | Status: AC
  Filled 2011-07-17: qty 2

## 2011-07-17 MED ORDER — LACTATED RINGERS IV SOLN
INTRAVENOUS | Status: DC
  Administered 2011-07-17 (×2): via INTRAVENOUS
  Administered 2011-07-17: 125 mL/h via INTRAVENOUS

## 2011-07-17 MED ORDER — SCOPOLAMINE 1 MG/3DAYS TD PT72
1.0000 | MEDICATED_PATCH | Freq: Once | TRANSDERMAL | Status: DC
Start: 2011-07-17 — End: 2011-07-19

## 2011-07-17 MED ORDER — MEPERIDINE HCL 25 MG/ML IJ SOLN
INTRAMUSCULAR | Status: AC
  Filled 2011-07-17: qty 1

## 2011-07-17 MED ORDER — WITCH HAZEL-GLYCERIN EX PADS: 1.0000 "application " | MEDICATED_PAD | CUTANEOUS | Status: DC | PRN

## 2011-07-17 MED ORDER — PHENYLEPHRINE 40 MCG/ML (10ML) SYRINGE FOR IV PUSH (FOR BLOOD PRESSURE SUPPORT)
PREFILLED_SYRINGE | INTRAVENOUS | Status: AC
  Filled 2011-07-17: qty 15

## 2011-07-17 MED ORDER — NALOXONE HCL 0.4 MG/ML IJ SOLN: 0.4000 mg | INTRAMUSCULAR | Status: DC | PRN

## 2011-07-17 MED ORDER — DIPHENHYDRAMINE HCL 25 MG PO CAPS
25.0000 mg | ORAL_CAPSULE | ORAL | Status: DC | PRN
  Filled 2011-07-17: qty 1

## 2011-07-17 MED ORDER — MEDROXYPROGESTERONE ACETATE 150 MG/ML IM SUSP: 150.0000 mg | INTRAMUSCULAR | Status: DC | PRN

## 2011-07-17 MED ORDER — ONDANSETRON HCL 4 MG PO TABS: 4.0000 mg | ORAL_TABLET | ORAL | Status: DC | PRN

## 2011-07-17 SURGICAL SUPPLY — 32 items
CHLORAPREP W/TINT 26ML (MISCELLANEOUS) ×2 IMPLANT
CLOTH BEACON ORANGE TIMEOUT ST (SAFETY) ×2 IMPLANT
DERMABOND ADVANCED (GAUZE/BANDAGES/DRESSINGS) ×2
DERMABOND ADVANCED .7 DNX12 (GAUZE/BANDAGES/DRESSINGS) ×2 IMPLANT
ELECT REM PT RETURN 9FT ADLT (ELECTROSURGICAL) ×2
ELECTRODE REM PT RTRN 9FT ADLT (ELECTROSURGICAL) ×1 IMPLANT
EXTRACTOR VACUUM M CUP 4 TUBE (SUCTIONS) IMPLANT
GLOVE BIO SURGEON STRL SZ 6.5 (GLOVE) ×2 IMPLANT
GLOVE BIOGEL PI IND STRL 7.0 (GLOVE) ×3 IMPLANT
GLOVE BIOGEL PI IND STRL 7.5 (GLOVE) ×1 IMPLANT
GLOVE BIOGEL PI INDICATOR 7.0 (GLOVE) ×3
GLOVE BIOGEL PI INDICATOR 7.5 (GLOVE) ×1
GLOVE ECLIPSE 7.0 STRL STRAW (GLOVE) ×2 IMPLANT
GLOVE SURG SS PI 7.0 STRL IVOR (GLOVE) ×2 IMPLANT
GOWN PREVENTION PLUS LG XLONG (DISPOSABLE) ×6 IMPLANT
GOWN STRL REIN XL XLG (GOWN DISPOSABLE) ×2 IMPLANT
KIT ABG SYR 3ML LUER SLIP (SYRINGE) ×2 IMPLANT
NEEDLE HYPO 25X5/8 SAFETYGLIDE (NEEDLE) ×2 IMPLANT
NS IRRIG 1000ML POUR BTL (IV SOLUTION) ×2 IMPLANT
PACK C SECTION WH (CUSTOM PROCEDURE TRAY) ×2 IMPLANT
SLEEVE SCD COMPRESS KNEE MED (MISCELLANEOUS) IMPLANT
STAPLER VISISTAT 35W (STAPLE) IMPLANT
SUT CHROMIC 0 CT 802H (SUTURE) IMPLANT
SUT CHROMIC 0 CTX 36 (SUTURE) ×8 IMPLANT
SUT MNCRL AB 3-0 PS2 27 (SUTURE) IMPLANT
SUT MON AB-0 CT1 36 (SUTURE) ×2 IMPLANT
SUT PDS AB 0 CTX 60 (SUTURE) ×2 IMPLANT
SUT PLAIN 0 NONE (SUTURE) IMPLANT
SUT VIC AB 4-0 KS 27 (SUTURE) ×2 IMPLANT
TOWEL OR 17X24 6PK STRL BLUE (TOWEL DISPOSABLE) ×4 IMPLANT
TRAY FOLEY CATH 14FR (SET/KITS/TRAYS/PACK) ×2 IMPLANT
WATER STERILE IRR 1000ML POUR (IV SOLUTION) ×2 IMPLANT

## 2011-07-17 NOTE — Transfer of Care (Signed)
Immediate Anesthesia Transfer of Care Note  Patient: Jessica Hubbard  Procedure(s) Performed: Procedure(s) (LRB): CESAREAN SECTION (N/A)  Patient Location: PACU  Anesthesia Type: Spinal  Level of Consciousness: awake, alert  and oriented  Airway & Oxygen Therapy: Patient Spontanous Breathing  Post-op Assessment: Report given to PACU RN and Post -op Vital signs reviewed and stable  Post vital signs: stable  Complications: No apparent anesthesia complications

## 2011-07-17 NOTE — Anesthesia Postprocedure Evaluation (Signed)
  Anesthesia Post-op Note  Patient: Jessica Hubbard  Procedure(s) Performed: Procedure(s) (LRB): CESAREAN SECTION (N/A)  Patient is awake, responsive, moving her legs, and has signs of resolution of her numbness. Pain and nausea are reasonably well controlled. Vital signs are stable and clinically acceptable. Oxygen saturation is clinically acceptable. There are no apparent anesthetic complications at this time. Patient is ready for discharge.

## 2011-07-17 NOTE — Progress Notes (Signed)
Baby blood sugar 39, baby back from nursery to attempt more breastfeeding, Mother states nipples are very tender and she doesn't know if she can tolerate the bf experience

## 2011-07-17 NOTE — Op Note (Signed)
Cesarean Section Procedure Note   Jessica Hubbard  07/17/2011  Indications: Severe POTS, pt request   Pre-operative Diagnosis: severe postural orthostatic tachycardia syndrome.   Post-operative Diagnosis: Same   Surgeon: Surgeon(s) and Role:    * Zelphia Cairo, MD - Primary   Assistants: none  Anesthesia: spinal   Procedure Details:  The patient was seen in the Holding Room. The risks, benefits, complications, treatment options, and expected outcomes were discussed with the patient. The patient concurred with the proposed plan, giving informed consent. identified as Jessica Hubbard and the procedure verified as C-Section Delivery. A Time Out was held and the above information confirmed.  After induction of anesthesia, the patient was draped and prepped in the usual sterile manner. A transverse was made and carried down through the subcutaneous tissue to the fascia. Fascial incision was made and extended transversely. The fascia was separated from the underlying rectus tissue superiorly and inferiorly. The peritoneum was identified and entered. Peritoneal incision was extended longitudinally. The utero-vesical peritoneal reflection was incised transversely and the bladder flap was bluntly freed from the lower uterine segment. A low transverse uterine incision was made. Delivered from cephalic presentation was a vigerous female infant Apgar scores of 8 at one minute and 9 at five minutes. Cord ph was not sent the umbilical cord was clamped and cut cord blood was obtained for evaluation. The placenta was removed Intact and appeared normal. The uterine outline, tubes and ovaries appeared normal}. The uterine incision was closed with running locked sutures of 0chromic gut.   Hemostasis was observed. Lavage was carried out until clear. The fascia was then reapproximated with running sutures of 0PDS.  The skin was closed with 4-0 vicryl.   Instrument, sponge, and needle counts were correct prior the  abdominal closure and were correct at the conclusion of the case.     Estimated Blood Loss: 800cc   Urine Output: clear  Specimens: @ORSPECIMEN @   Complications: no complications  Disposition: PACU - hemodynamically stable.   Maternal Condition: stable   Baby condition / location:  with mom, stable  Attending Attestation: I was present and scrubbed for the entire procedure.   Signed: Surgeon(s): Zelphia Cairo, MD

## 2011-07-17 NOTE — Anesthesia Procedure Notes (Signed)

## 2011-07-18 ENCOUNTER — Encounter (HOSPITAL_COMMUNITY): Payer: Self-pay | Admitting: Obstetrics and Gynecology

## 2011-07-18 LAB — CBC
Hemoglobin: 8.5 g/dL — ABNORMAL LOW (ref 12.0–15.0)
MCH: 27.3 pg (ref 26.0–34.0)
MCHC: 32.2 g/dL (ref 30.0–36.0)
Platelets: 247 10*3/uL (ref 150–400)
RDW: 14.2 % (ref 11.5–15.5)

## 2011-07-18 MED ORDER — FERROUS SULFATE 325 (65 FE) MG PO TABS
325.0000 mg | ORAL_TABLET | Freq: Every day | ORAL | Status: DC
  Administered 2011-07-18 – 2011-07-19 (×2): 325 mg via ORAL
  Filled 2011-07-18 (×2): qty 1

## 2011-07-18 MED ORDER — FLUOXETINE HCL 20 MG PO CAPS
20.0000 mg | ORAL_CAPSULE | Freq: Every day | ORAL | Status: DC
  Administered 2011-07-18 – 2011-07-19 (×2): 20 mg via ORAL
  Filled 2011-07-18 (×3): qty 1

## 2011-07-18 NOTE — Plan of Care (Signed)
Problem: Discharge Progression Outcomes Goal: Activity appropriate for discharge plan Outcome: Completed/Met Date Met:  07/18/11 Walking to bath room accompanied by either nurse tech, RN or husband. No dizzy spells today. Goal: Remove staples per MD order Outcome: Not Applicable Date Met:  07/18/11 Dermabond on incision.

## 2011-07-18 NOTE — Progress Notes (Signed)
UR chart review completed.  

## 2011-07-18 NOTE — Anesthesia Postprocedure Evaluation (Signed)
Anesthesia Post Note  Patient: Jessica Hubbard  Procedure(s) Performed: Procedure(s) (LRB): CESAREAN SECTION (N/A)  Anesthesia type: SAB  Patient location: Mother/Baby  Post pain: Pain level controlled  Post assessment: Post-op Vital signs reviewed  Last Vitals:  Filed Vitals:   07/18/11 0528  BP: 121/65  Pulse: 77  Temp: 36.4 C  Resp: 18    Post vital signs: Reviewed  Level of consciousness: awake  Complications: No apparent anesthesia complications

## 2011-07-18 NOTE — Progress Notes (Signed)
Subjective: Postpartum Day 1: Cesarean Delivery Patient reports tolerating PO and no problems voiding.  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 Used prozac without any problem.  Objective: Vital signs in last 24 hours: Temp:  [97.6 F (36.4 C)-99 F (37.2 C)] 97.6 F (36.4 C) (05/10 0528) Pulse Rate:  [73-112] 77  (05/10 0528) Resp:  [16-28] 18  (05/10 0528) BP: (96-131)/(33-86) 121/65 mmHg (05/10 0528) SpO2:  [95 %-100 %] 100 % (05/10 0528) Weight:  [94.802 kg (209 lb)] 94.802 kg (209 lb) (05/09 1115)  Physical Exam:  General: alert, cooperative and appears stated age Lochia: appropriate Uterine Fundus: firm Incision: healing well, no significant drainage, no dehiscence, no significant erythema DVT Evaluation: No evidence of DVT seen on physical exam.   Basename 07/18/11 0545  HGB 8.5*  HCT 26.4*    Assessment/Plan: Status post Cesarean section. Doing well postoperatively.  Continue current care Iron  Prozac.  Casie Sturgeon L 07/18/2011, 7:26 AM

## 2011-07-18 NOTE — Addendum Note (Signed)
Addendum  created 07/18/11 0746 by Jhonnie Garner, CRNA   Modules edited:Notes Section

## 2011-07-19 MED ORDER — OXYCODONE-ACETAMINOPHEN 5-325 MG PO TABS: 1.0000 | ORAL_TABLET | ORAL | Status: AC | PRN

## 2011-07-19 MED ORDER — IBUPROFEN 600 MG PO TABS: 600.0000 mg | ORAL_TABLET | Freq: Four times a day (QID) | ORAL | Status: AC | PRN

## 2011-07-19 MED ORDER — FLUOXETINE HCL 20 MG PO CAPS: 20.0000 mg | ORAL_CAPSULE | Freq: Every day | ORAL | Status: AC

## 2011-07-19 MED ORDER — FERROUS SULFATE 325 (65 FE) MG PO TABS: 325.0000 mg | ORAL_TABLET | Freq: Every day | ORAL | Status: AC

## 2011-07-19 NOTE — Discharge Summary (Signed)
Obstetric Discharge Summary Reason for Admission: cesarean section Prenatal Procedures: none Intrapartum Procedures: cesarean: low cervical, transverse Postpartum Procedures: none Complications-Operative and Postpartum: none Hemoglobin  Date Value Range Status  07/18/2011 8.5* 12.0-15.0 (g/dL) Final     HCT  Date Value Range Status  07/18/2011 26.4* 36.0-46.0 (%) Final    Physical Exam:  General: alert, cooperative and appears stated age 31: appropriate Uterine Fundus: firm Incision: healing well, no significant drainage, no dehiscence, no significant erythema DVT Evaluation: No evidence of DVT seen on physical exam.  Discharge Diagnoses: Term Pregnancy-delivered  Discharge Information: Date: 07/19/2011 Activity: pelvic rest Diet: routine Medications: Ibuprofen, Iron and Percocet Condition: stable Instructions: refer to practice specific booklet Discharge to: home   Newborn Data: Live born female  Birth Weight: 9 lb 15.4 oz (4520 g) APGAR: 8, 9  Home with mother.  Recie Cirrincione L 07/19/2011, 10:39 AM

## 2011-07-19 NOTE — Progress Notes (Signed)
Subjective: Postpartum Day 2: Cesarean Delivery Patient reports incisional pain, tolerating PO and no problems voiding.    Objective: Vital signs in last 24 hours: Temp:  [97.9 F (36.6 C)-98.4 F (36.9 C)] 98 F (36.7 C) (05/11 0513) Pulse Rate:  [87-93] 90  (05/11 0513) Resp:  [18] 18  (05/11 0513) BP: (98-108)/(65-67) 108/67 mmHg (05/11 0513) SpO2:  [97 %] 97 % (05/10 1330)  Physical Exam:  General: alert, cooperative and appears stated age Lochia: appropriate Uterine Fundus: firm Incision: healing well, no significant drainage, no dehiscence, no significant erythema DVT Evaluation: No evidence of DVT seen on physical exam.   Basename 07/18/11 0545  HGB 8.5*  HCT 26.4*    Assessment/Plan: Status post Cesarean section. Doing well postoperatively.  Continue current care Patient desires circ - counseled and consented+.  Juliya Magill L 07/19/2011, 7:48 AM
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
## 2014-01-09 ENCOUNTER — Encounter (HOSPITAL_COMMUNITY): Payer: Self-pay | Admitting: Obstetrics and Gynecology

## 2020-07-06 ENCOUNTER — Encounter (HOSPITAL_BASED_OUTPATIENT_CLINIC_OR_DEPARTMENT_OTHER): Payer: Self-pay

## 2020-07-06 ENCOUNTER — Other Ambulatory Visit: Payer: Self-pay

## 2020-07-06 ENCOUNTER — Emergency Department (HOSPITAL_BASED_OUTPATIENT_CLINIC_OR_DEPARTMENT_OTHER)
Admission: EM | Admit: 2020-07-06 | Discharge: 2020-07-06 | Disposition: A | Discharge status: Home / Self Care | Payer: BC Managed Care – PPO | Attending: Emergency Medicine | Admitting: Emergency Medicine

## 2020-07-06 DIAGNOSIS — Z87891 Personal history of nicotine dependence: Secondary | ICD-10-CM | POA: Diagnosis not present

## 2020-07-06 DIAGNOSIS — J45909 Unspecified asthma, uncomplicated: Secondary | ICD-10-CM | POA: Diagnosis not present

## 2020-07-06 DIAGNOSIS — Z79899 Other long term (current) drug therapy: Secondary | ICD-10-CM | POA: Insufficient documentation

## 2020-07-06 DIAGNOSIS — I10 Essential (primary) hypertension: Secondary | ICD-10-CM | POA: Insufficient documentation

## 2020-07-06 LAB — CBC WITH DIFFERENTIAL/PLATELET
Abs Immature Granulocytes: 0.03 10*3/uL (ref 0.00–0.07)
Basophils Absolute: 0.1 10*3/uL (ref 0.0–0.1)
Basophils Relative: 1 %
Eosinophils Absolute: 0.3 10*3/uL (ref 0.0–0.5)
Eosinophils Relative: 3 %
HCT: 52.7 % — ABNORMAL HIGH (ref 36.0–46.0)
Hemoglobin: 17.4 g/dL — ABNORMAL HIGH (ref 12.0–15.0)
Immature Granulocytes: 0 %
Lymphocytes Relative: 22 %
Lymphs Abs: 2.7 10*3/uL (ref 0.7–4.0)
MCH: 30.7 pg (ref 26.0–34.0)
MCHC: 33 g/dL (ref 30.0–36.0)
MCV: 93.1 fL (ref 80.0–100.0)
Monocytes Absolute: 0.8 10*3/uL (ref 0.1–1.0)
Monocytes Relative: 7 %
Neutro Abs: 8.1 10*3/uL — ABNORMAL HIGH (ref 1.7–7.7)
Neutrophils Relative %: 67 %
Platelets: 292 10*3/uL (ref 150–400)
RBC: 5.66 MIL/uL — ABNORMAL HIGH (ref 3.87–5.11)
RDW: 12.9 % (ref 11.5–15.5)
WBC: 12 10*3/uL — ABNORMAL HIGH (ref 4.0–10.5)
nRBC: 0 % (ref 0.0–0.2)

## 2020-07-06 LAB — BASIC METABOLIC PANEL
Anion gap: 11 (ref 5–15)
BUN: 10 mg/dL (ref 6–20)
CO2: 24 mmol/L (ref 22–32)
Calcium: 9.3 mg/dL (ref 8.9–10.3)
Chloride: 100 mmol/L (ref 98–111)
Creatinine, Ser: 0.79 mg/dL (ref 0.44–1.00)
GFR, Estimated: >60 mL/min (ref >60)
Glucose, Bld: 93 mg/dL (ref 70–99)
Potassium: 4.4 mmol/L (ref 3.5–5.1)
Sodium: 135 mmol/L (ref 135–145)

## 2020-07-06 LAB — PREGNANCY, URINE: Preg Test, Ur: NEGATIVE

## 2020-07-06 MED ORDER — SODIUM CHLORIDE 0.9 % IV BOLUS
1000.0000 mL | Freq: Once | INTRAVENOUS | Status: AC
  Administered 2020-07-06: 1000 mL via INTRAVENOUS

## 2020-07-06 NOTE — ED Triage Notes (Signed)
Arrives with reports of high BP feeling sluggish and tired. Pt states it was 158/120 at work, took Metoprolol and rechecked it states it was then 140/110.

## 2020-07-06 NOTE — ED Provider Notes (Signed)
[facility] EMERGENCY DEPARTMENT Provider Note   CSN: [id number] Arrival date & time: 07/06/20  1254     History Chief Complaint  Patient presents with  . Hypertension    Jessica Hubbard is a 40 y.o. female.  The history is provided by the patient.  Hypertension This is a new problem. Pertinent negatives include no chest pain, no abdominal pain, no headaches and no shortness of breath. Nothing aggravates the symptoms. Nothing relieves the symptoms. She has tried nothing for the symptoms. The treatment provided no relief.       Past Medical History:  Diagnosis Date  . Anxiety    history - no meds  . Asthma   . Heartburn in pregnancy   . POTS (postural orthostatic tachycardia syndrome)   . Seasonal allergies   . Tachycardia     Patient Active Problem List   Diagnosis Date Noted  . Tachycardia 06/11/2011  . Asthma 02/26/2011  . Nausea and vomiting of pregnancy, antepartum 01/29/2011    Past Surgical History:  Procedure Laterality Date  . CESAREAN SECTION  07/17/2011   Procedure: CESAREAN SECTION;  Surgeon: Zelphia Cairo, MD;  Location: [facility];  Service: Gynecology;  Laterality: N/A;  Primary edc 07/29/11  . NO PAST SURGERIES    . SVD     x 1 w/ epidural  . WISDOM TOOTH EXTRACTION     only 1 tooth     OB History    Gravida  3   Para  2   Term  2   Preterm  0   AB  1   Living  2     SAB  1   IAB  0   Ectopic  0   Multiple  0   Live Births  1           Family History  Problem Relation Age of Onset  . Hypertension Father   . Hypertension Paternal Grandmother   . Cancer Other   . Anxiety disorder Other   . Depression Other   . Heart attack Mother     Social History   Tobacco Use  . Smoking status: Former Smoker    Packs/day: 0.50    Years: 2.00    Pack years: 1.00    Types: Cigarettes    Quit date: 06/09/2010    Years since quitting: 10.0  . Smokeless tobacco: Never Used  Substance Use Topics  . Alcohol use: Yes     Comment: daily wine "several glass a night"  . Drug use: No    Home Medications Prior to Admission medications   Medication Sig Start Date End Date Taking? Authorizing Provider  budesonide-formoterol (SYMBICORT) 160-4.5 MCG/ACT inhaler Inhale 2 puffs into the lungs 2 (two) times daily as needed. For difficulty breathing; using as needed    [provider]  busPIRone (BUSPAR) 10 MG tablet Take 10 mg by mouth 2 (two) times daily. 06/26/20   [provider]  calcium carbonate (TUMS - DOSED IN MG ELEMENTAL CALCIUM) 500 MG chewable tablet Chew 2 tablets by mouth as needed. For heartburn    [provider]  ferrous sulfate 325 (65 FE) MG tablet Take 1 tablet (325 mg total) by mouth daily with breakfast. 07/19/11 07/18/12  Marcelle Overlie, MD  FLUoxetine (PROZAC) 20 MG capsule Take 1 capsule (20 mg total) by mouth daily. 07/19/11 07/18/12  Marcelle Overlie, MD  metoprolol tartrate (LOPRESSOR) 25 MG tablet Take by mouth.    [provider]  montelukast (SINGULAIR) 10 MG tablet Take by mouth.    [provider]  norethindrone (MICRONOR) 0.35 MG tablet Take 1 tablet by mouth daily. 05/29/20   [provider]  albuterol (PROVENTIL HFA;VENTOLIN HFA) 108 (90 BASE) MCG/ACT inhaler Inhale 2 puffs into the lungs every 6 (six) hours as needed. Shortness of breathe   05/23/11  [provider]    Allergies    Hexavitamins  Review of Systems   Review of Systems  Constitutional: Positive for fatigue. Negative for chills and fever.  HENT: Negative for ear pain and sore throat.   Eyes: Negative for pain and visual disturbance.  Respiratory: Negative for cough and shortness of breath.   Cardiovascular: Negative for chest pain and palpitations.  Gastrointestinal: Negative for abdominal pain and vomiting.  Genitourinary: Negative for dysuria and hematuria.  Musculoskeletal: Negative for arthralgias and back pain.  Skin: Negative for color change and  rash.  Neurological: Negative for seizures, syncope and headaches.  All other systems reviewed and are negative.   Physical Exam Updated Vital Signs  ED Triage Vitals  Enc Vitals Group     BP 07/06/20 1303 (!) 157/97     Pulse Rate 07/06/20 1303 81     Resp 07/06/20 1303 20     Temp 07/06/20 1303 98.5 F (36.9 C)     Temp Source 07/06/20 1303 Oral     SpO2 07/06/20 1303 100 %     Weight 07/06/20 1312 210 lb (95.3 kg)     Height 07/06/20 1312 5\' 6"  (1.676 m)     Head Circumference --      Peak Flow --      Pain Score 07/06/20 1312 0     Pain Loc --      Pain Edu? --      Excl. in GC? --     Physical Exam Vitals and nursing note reviewed.  Constitutional:      General: She is not in acute distress.    Appearance: She is well-developed. She is not ill-appearing.  HENT:     Head: Normocephalic and atraumatic.     Mouth/Throat:     Mouth: Mucous membranes are moist.  Eyes:     Extraocular Movements: Extraocular movements intact.     Conjunctiva/sclera: Conjunctivae normal.     Pupils: Pupils are equal, round, and reactive to light.  Cardiovascular:     Rate and Rhythm: Normal rate and regular rhythm.     Pulses: Normal pulses.     Heart sounds: Normal heart sounds. No murmur heard.   Pulmonary:     Effort: Pulmonary effort is normal. No respiratory distress.     Breath sounds: Normal breath sounds.  Abdominal:     General: Abdomen is flat.     Palpations: Abdomen is soft.     Tenderness: There is no abdominal tenderness.  Musculoskeletal:     Cervical back: Normal range of motion and neck supple.  Skin:    General: Skin is warm and dry.     Capillary Refill: Capillary refill takes less than 2 seconds.  Neurological:     General: No focal deficit present.     Mental Status: She is alert and oriented to person, place, and time.     Cranial Nerves: No cranial nerve deficit.     Sensory: No sensory deficit.     Motor: No weakness.     Coordination: Coordination  normal.  Psychiatric:  Mood and Affect: Mood normal.     ED Results / Procedures / Treatments   Labs (all labs ordered are listed, but only abnormal results are displayed) Labs Reviewed  CBC WITH DIFFERENTIAL/PLATELET - Abnormal; Notable for the following components:      Result Value   WBC 12.0 (*)    RBC 5.66 (*)    Hemoglobin 17.4 (*)    HCT 52.7 (*)    Neutro Abs 8.1 (*)    All other components within normal limits  BASIC METABOLIC PANEL  PREGNANCY, URINE    EKG EKG Interpretation  Date/Time:  Friday July 06 2020 13:38:12 EDT Ventricular Rate:  76 PR Interval:  135 QRS Duration: 80 QT Interval:  394 QTC Calculation: 443 R Axis:   66 Text Interpretation: Sinus rhythm Borderline T abnormalities, anterior leads Confirmed by Virgina Norfolk ([id number]) on 07/06/2020 1:57:02 PM   Radiology No results found.  Procedures Procedures   Medications Ordered in ED Medications  sodium chloride 0.9 % bolus 1,000 mL (1,000 mLs Intravenous New Bag/Given 07/06/20 1357)    ED Course  I have reviewed the triage vital signs and the nursing notes.  Pertinent labs & imaging results that were available during my care of the patient were reviewed by me and considered in my medical decision making (see chart for details).    MDM Rules/Calculators/A&P                          Irmgard Rampersaud is here with high blood pressure.  Felt sluggish at work today and noticed her blood pressure is high.  Normal vitals here.  Blood pressure 120/87 on my evaluation.  Will check basic labs.  No concern for ACS or stroke.  Overall asymptomatic.  Lab work overall unremarkable.  Hemoglobin was 17.4.  Possibly some mild dehydration.  Was given fluid bolus.  Recommend close follow-up with primary care doctor.  Discharged in good condition.  This chart was dictated using voice recognition software.  Despite best efforts to proofread,  errors can occur which can change the documentation meaning.     Final Clinical Impression(s) / ED Diagnoses Final diagnoses:  Hypertension, unspecified type    Rx / DC Orders ED Discharge Orders    None       Virgina Norfolk, DO 07/06/20 1434
